# Patient Record
Sex: Male | Born: 1955 | Hispanic: No | Marital: Married | Smoking: Current every day smoker
Health system: Southern US, Community
[De-identification: ages and names within clinical notes are randomized; demographics above are authoritative.]

## PROBLEM LIST (undated history)

## (undated) ENCOUNTER — Ambulatory Visit (HOSPITAL_COMMUNITY): Source: Home / Self Care

## (undated) DIAGNOSIS — J45909 Unspecified asthma, uncomplicated: Secondary | ICD-10-CM

## (undated) DIAGNOSIS — N400 Enlarged prostate without lower urinary tract symptoms: Secondary | ICD-10-CM

## (undated) DIAGNOSIS — I10 Essential (primary) hypertension: Secondary | ICD-10-CM

---

## 2019-08-03 ENCOUNTER — Other Ambulatory Visit: Payer: Self-pay

## 2019-08-03 ENCOUNTER — Emergency Department
Admission: EM | Admit: 2019-08-03 | Discharge: 2019-08-03 | Disposition: A | Discharge status: Home / Self Care | Payer: Self-pay | Attending: Student | Admitting: Student

## 2019-08-03 ENCOUNTER — Emergency Department: Payer: Self-pay

## 2019-08-03 DIAGNOSIS — J45901 Unspecified asthma with (acute) exacerbation: Secondary | ICD-10-CM | POA: Insufficient documentation

## 2019-08-03 DIAGNOSIS — F17200 Nicotine dependence, unspecified, uncomplicated: Secondary | ICD-10-CM | POA: Insufficient documentation

## 2019-08-03 LAB — CBC WITH DIFFERENTIAL/PLATELET
Abs Immature Granulocytes: 0.02 10*3/uL (ref 0.00–0.07)
Basophils Absolute: 0.1 10*3/uL (ref 0.0–0.1)
Basophils Relative: 1 %
Eosinophils Absolute: 0.4 10*3/uL (ref 0.0–0.5)
Eosinophils Relative: 7 %
HCT: 50.6 % (ref 39.0–52.0)
Hemoglobin: 17.3 g/dL — ABNORMAL HIGH (ref 13.0–17.0)
Immature Granulocytes: 0 %
Lymphocytes Relative: 41 %
Lymphs Abs: 2.2 10*3/uL (ref 0.7–4.0)
MCH: 30.5 pg (ref 26.0–34.0)
MCHC: 34.2 g/dL (ref 30.0–36.0)
MCV: 89.2 fL (ref 80.0–100.0)
Monocytes Absolute: 0.6 10*3/uL (ref 0.1–1.0)
Monocytes Relative: 10 %
Neutro Abs: 2.2 10*3/uL (ref 1.7–7.7)
Neutrophils Relative %: 41 %
Platelets: 377 10*3/uL (ref 150–400)
RBC: 5.67 MIL/uL (ref 4.22–5.81)
RDW: 13.6 % (ref 11.5–15.5)
WBC: 5.5 10*3/uL (ref 4.0–10.5)
nRBC: 0 % (ref 0.0–0.2)

## 2019-08-03 LAB — COMPREHENSIVE METABOLIC PANEL
ALT: 17 U/L (ref 0–44)
AST: 21 U/L (ref 15–41)
Albumin: 4.2 g/dL (ref 3.5–5.0)
Alkaline Phosphatase: 78 U/L (ref 38–126)
Anion gap: 10 (ref 5–15)
BUN: 17 mg/dL (ref 8–23)
CO2: 24 mmol/L (ref 22–32)
Calcium: 9.4 mg/dL (ref 8.9–10.3)
Chloride: 104 mmol/L (ref 98–111)
Creatinine, Ser: 0.94 mg/dL (ref 0.61–1.24)
GFR calc Af Amer: >60 mL/min (ref >60)
GFR calc non Af Amer: >60 mL/min (ref >60)
Glucose, Bld: 95 mg/dL (ref 70–99)
Potassium: 4.1 mmol/L (ref 3.5–5.1)
Sodium: 138 mmol/L (ref 135–145)
Total Bilirubin: 0.6 mg/dL (ref 0.3–1.2)
Total Protein: 8.2 g/dL — ABNORMAL HIGH (ref 6.5–8.1)

## 2019-08-03 LAB — TROPONIN I (HIGH SENSITIVITY): Troponin I (High Sensitivity): 5 ng/L (ref <18)

## 2019-08-03 MED ORDER — BENZONATATE 100 MG PO CAPS: 100.0000 mg | ORAL_CAPSULE | Freq: Three times a day (TID) | ORAL | 0 refills | Status: AC | PRN

## 2019-08-03 MED ORDER — BENZONATATE 100 MG PO CAPS: 100.0000 mg | ORAL_CAPSULE | Freq: Three times a day (TID) | ORAL | 0 refills | Status: DC | PRN

## 2019-08-03 MED ORDER — ALBUTEROL SULFATE (2.5 MG/3ML) 0.083% IN NEBU
5.0000 mg | INHALATION_SOLUTION | Freq: Once | RESPIRATORY_TRACT | Status: AC
  Administered 2019-08-03: 5 mg via RESPIRATORY_TRACT
  Filled 2019-08-03: qty 6

## 2019-08-03 MED ORDER — PREDNISONE 50 MG PO TABS: 50.0000 mg | ORAL_TABLET | Freq: Every day | ORAL | 0 refills | Status: DC

## 2019-08-03 MED ORDER — IPRATROPIUM BROMIDE 0.02 % IN SOLN
0.5000 mg | RESPIRATORY_TRACT | Status: AC
  Administered 2019-08-03: 0.5 mg via RESPIRATORY_TRACT
  Filled 2019-08-03: qty 2.5

## 2019-08-03 MED ORDER — ALBUTEROL SULFATE (2.5 MG/3ML) 0.083% IN NEBU
2.5000 mg | INHALATION_SOLUTION | Freq: Once | RESPIRATORY_TRACT | Status: AC
  Administered 2019-08-03: 2.5 mg via RESPIRATORY_TRACT
  Filled 2019-08-03: qty 3

## 2019-08-03 MED ORDER — METHYLPREDNISOLONE SODIUM SUCC 125 MG IJ SOLR
125.0000 mg | Freq: Once | INTRAMUSCULAR | Status: AC
  Administered 2019-08-03: 20:00:00 125 mg via INTRAVENOUS
  Filled 2019-08-03: qty 2

## 2019-08-03 MED ORDER — PREDNISONE 50 MG PO TABS: 50.0000 mg | ORAL_TABLET | Freq: Every day | ORAL | 0 refills | Status: AC

## 2019-08-03 MED ORDER — IPRATROPIUM-ALBUTEROL 0.5-2.5 (3) MG/3ML IN SOLN
3.0000 mL | Freq: Once | RESPIRATORY_TRACT | Status: AC
  Administered 2019-08-03: 3 mL via RESPIRATORY_TRACT
  Filled 2019-08-03: qty 3

## 2019-08-03 NOTE — Discharge Instructions (Addendum)
Thank you for letting us take care of you in the emergency department today.   Please continue to take any regular, prescribed medications.   New medications we have prescribed:  - Prednisone - steroid medication to take for the next 5 days - Tessalon - for cough - Please use your albuterol inhaler (yellow) scheduled for the next 2 days - use 4 puffs every 4-6 hours  Please follow up with: - Your primary care doctor to review your ER visit and follow up on your symptoms.   Please return to the ER for any new or worsening symptoms.

## 2019-08-03 NOTE — ED Triage Notes (Signed)
Pt to the er for SOB x 2 days. Pt reports cough with white sputum. Denies fever. Pt denies any medical hx or medications. BP elevated in triage. Sats are adequate but pt is in obvious moderate resp distress. Pt is able to answer questions with some difficulty.

## 2019-08-03 NOTE — ED Provider Notes (Signed)
[facility] Emergency Department Provider Note  ____________________________________________   First MD Initiated Contact with Patient 08/03/19 1926     (approximate)  I have reviewed the triage vital signs and the nursing notes.  History  Chief Complaint Shortness of Breath    HPI Dillon Simmons is a 63 y.o. male with history of asthma who presents for asthma exacerbation, namely wheezing, cough, chest tightness. He reports symptoms have been slowly worsening over the last week or so. He is compliant with his inhalers. He has been using his PRN albuterol inhaler with mild, temporary relief. He has a nonproductive cough. No fevers, chills, vomiting, diarrhea, or sick contacts. No chest pain.    Past Medical Hx History reviewed. No pertinent past medical history.  Problem List There are no active problems to display for this patient.   Past Surgical Hx History reviewed. No pertinent surgical history.  Medications Prior to Admission medications   Not on File    Allergies Patient has no allergy information on record.  Family Hx No family history on file.  Social Hx Social History   Tobacco Use  . Smoking status: Current Every Day Smoker    Packs/day: 1.00  . Smokeless tobacco: Never Used  Substance Use Topics  . Alcohol use: Never    Frequency: Never  . Drug use: Never     Review of Systems  Constitutional: Negative for fever, chills. Eyes: Negative for visual changes. ENT: Negative for sore throat. Cardiovascular: Negative for chest pain. Respiratory: + asthma exacerbation - cough, wheezing Gastrointestinal: Negative for nausea, vomiting.  Genitourinary: Negative for dysuria. Musculoskeletal: Negative for leg swelling. Skin: Negative for rash. Neurological: Negative for for headaches.   Physical Exam  Vital Signs: ED Triage Vitals  Enc Vitals Group     BP 08/03/19 1909 (!) 201/95     Pulse Rate 08/03/19 1909 63     Resp  08/03/19 1909 (!) 30     Temp 08/03/19 1909 97.8 F (36.6 C)     Temp Source 08/03/19 1909 Oral     SpO2 08/03/19 1909 97 %     Weight 08/03/19 1911 209 lb (94.8 kg)     Height 08/03/19 1911 6\' 2"  (1.88 m)     Head Circumference --      Peak Flow --      Pain Score 08/03/19 1910 5     Pain Loc --      Pain Edu? --      Excl. in GC? --     Constitutional: Alert and oriented.  Head: Normocephalic. Atraumatic. Eyes: Conjunctivae clear. Sclera anicteric. Nose: No congestion. No rhinorrhea. Mouth/Throat: Mucous membranes are moist.  Neck: No stridor.   Cardiovascular: Normal rate, regular rhythm. Extremities well perfused. Respiratory: Wheezing throughout with moderately decreased air movement. No focal decreased BS. Dry cough. Speaking full sentences. Gastrointestinal: Soft. Non-tender. Non-distended.  Musculoskeletal: No lower extremity edema. No deformities. Neurologic:  Normal speech and language. No gross focal neurologic deficits are appreciated.  Skin: Skin is warm, dry and intact. No rash noted. Psychiatric: Mood and affect are appropriate for situation.  EKG  Personally reviewed.   Rate: within normal limits Rhythm: sinus Axis: normal Intervals: within normal limits Moderate baseline artifact No acute ischemic changes No STEMI     Radiology  XR:  IMPRESSION:  No acute abnormality of the lungs in AP portable projection.    Procedures  Procedure(s) performed (including critical care):  Procedures   Initial Impression /  Assessment and Plan / ED Course  63 y.o. male who presents to the ED for asthma exacerbation  Ddx: presentation c/w exacerbation of known asthma, consider underlying pulmonary infection as well  Plan: labs, steroids, nebulizer, XR  XR negative. Labs w/o actionable derangements. EKG w/o ischemic changes, HS troponin negative. After Solumedrol and nebulizer x 2 patient reports marked improvement in symptoms. On exam, he is moving air  much better with only faint scattered wheezes. As such, will discharge with course of prednisone, cough medicine. Advised scheduled albuterol for next several days. Recommend PCP follow up and given return precautions.     Final Clinical Impression(s) / ED Diagnosis  Final diagnoses:  Moderate asthma with exacerbation, unspecified whether persistent       Note:  This document was prepared using Dragon voice recognition software and may include unintentional dictation errors.   Lilia Pro., MD 08/04/19 [phone]

## 2020-12-01 IMAGING — DX DG CHEST 1V PORT
1 series · 2 of 2 positions shown · non-contrast
Comparison: None.

CLINICAL DATA: Shortness of breath, cough

EXAM:
PORTABLE CHEST 1 VIEW

[Series 1: chest ap · 0.14mm/px · 2 of 2 slices shown]
[im 1/2]
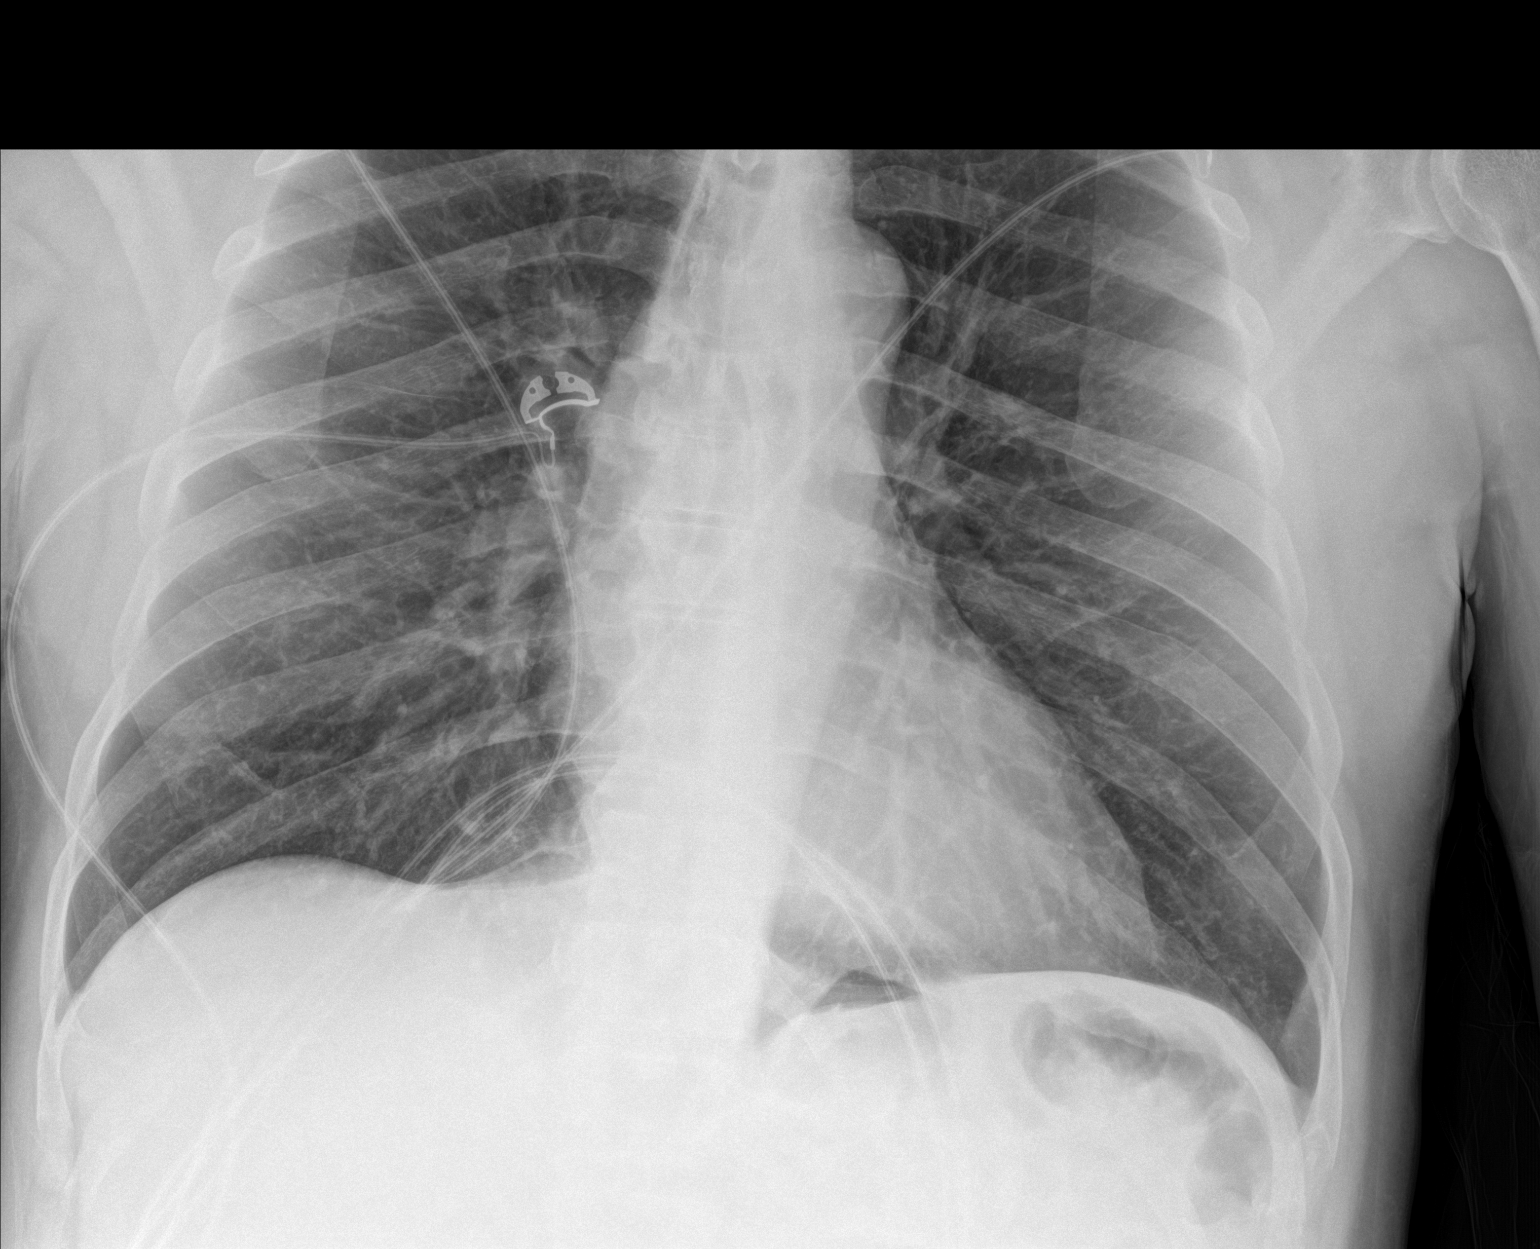
[im 2/2]
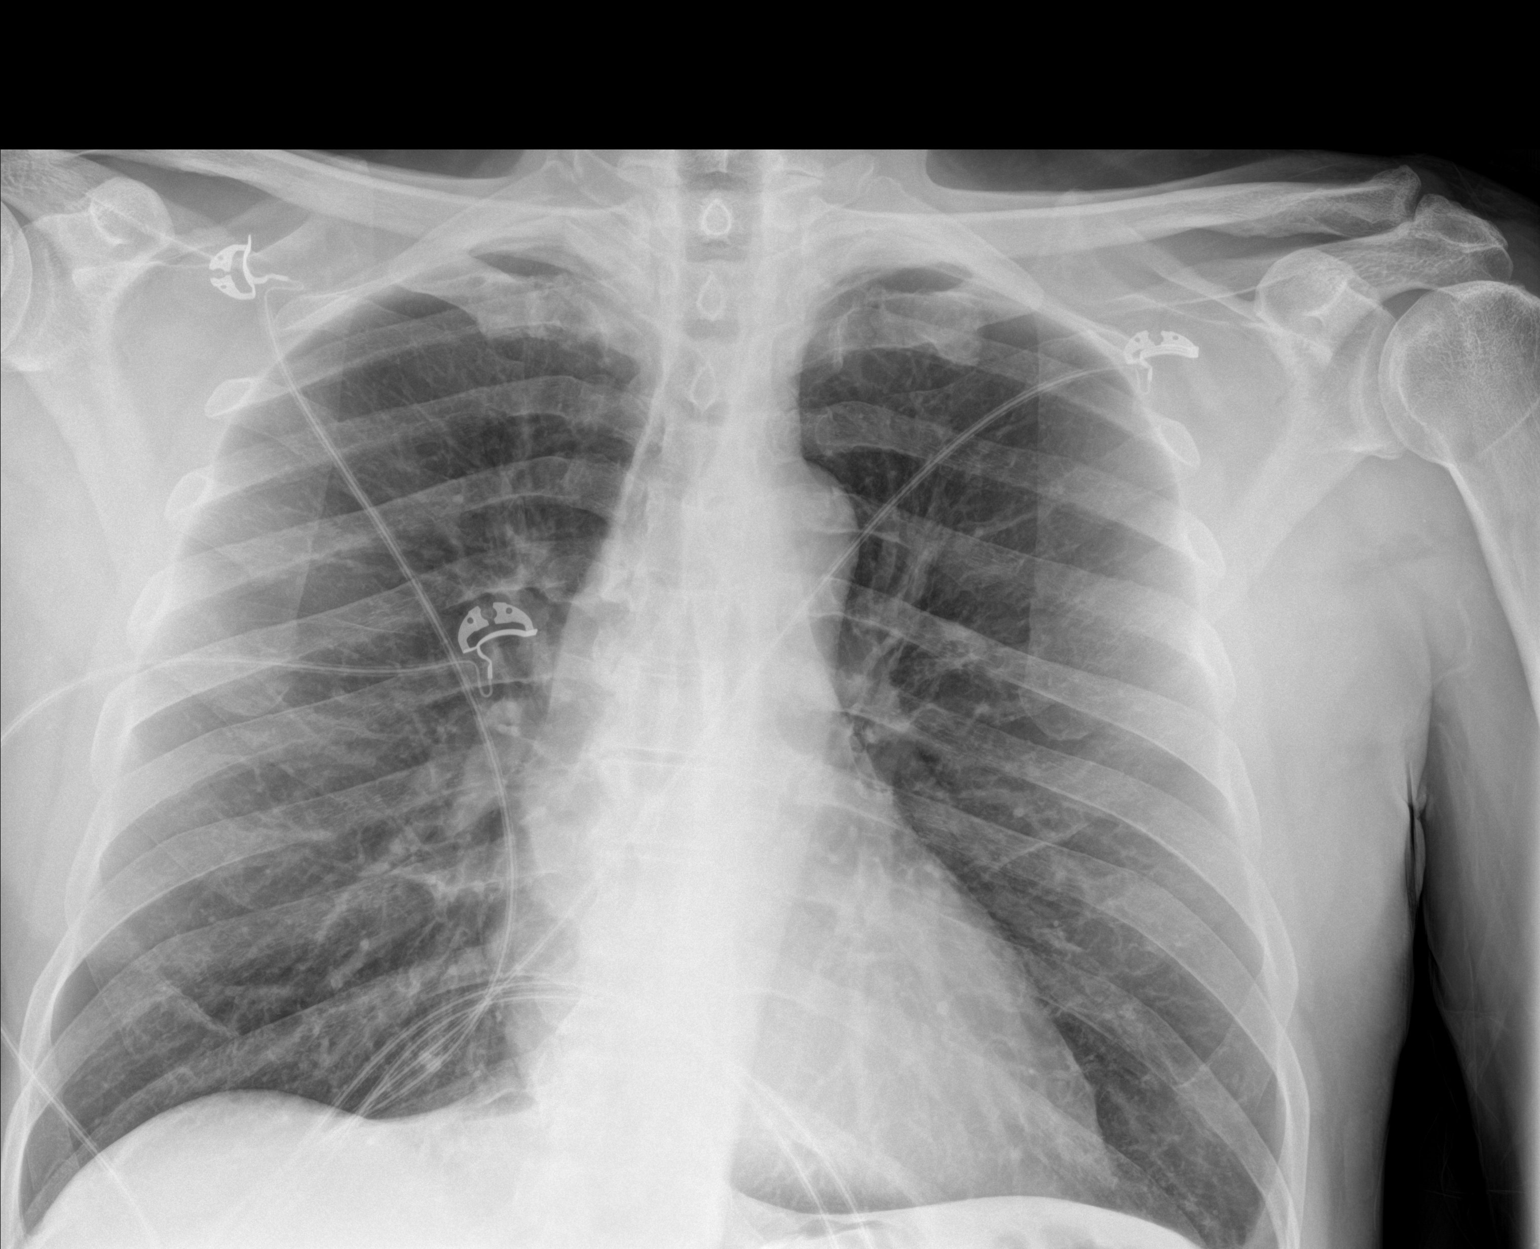

[2 of 2 positions shown; findings below may reference images not displayed]

FINDINGS: The heart size and mediastinal contours are within normal limits.
Both lungs are clear. The visualized skeletal structures are
unremarkable.
IMPRESSION: No acute abnormality of the lungs in AP portable projection.

## 2021-02-20 ENCOUNTER — Emergency Department
Admission: EM | Admit: 2021-02-20 | Discharge: 2021-02-20 | Disposition: A | Discharge status: Home / Self Care | Payer: Self-pay | Attending: Emergency Medicine | Admitting: Emergency Medicine

## 2021-02-20 ENCOUNTER — Other Ambulatory Visit: Payer: Self-pay

## 2021-02-20 DIAGNOSIS — F17200 Nicotine dependence, unspecified, uncomplicated: Secondary | ICD-10-CM | POA: Insufficient documentation

## 2021-02-20 DIAGNOSIS — K5900 Constipation, unspecified: Secondary | ICD-10-CM | POA: Insufficient documentation

## 2021-02-20 DIAGNOSIS — K645 Perianal venous thrombosis: Secondary | ICD-10-CM | POA: Insufficient documentation

## 2021-02-20 DIAGNOSIS — J45909 Unspecified asthma, uncomplicated: Secondary | ICD-10-CM | POA: Insufficient documentation

## 2021-02-20 HISTORY — DX: Unspecified asthma, uncomplicated: J45.909

## 2021-02-20 MED ORDER — HYDROCORTISONE ACETATE 25 MG RE SUPP: 25.0000 mg | Freq: Two times a day (BID) | RECTAL | 0 refills | Status: AC

## 2021-02-20 MED ORDER — HYDROCORT-PRAMOXINE (PERIANAL) 1-1 % EX FOAM
1.0000 | Freq: Once | CUTANEOUS | Status: AC
  Administered 2021-02-20: 1 via RECTAL
  Filled 2021-02-20: qty 10

## 2021-02-20 NOTE — Discharge Instructions (Addendum)
You were seen today for an external thrombosed hemorrhoid.  I am giving you prescription for Anusol suppositories to use twice daily for the next 6 days.  Please start Colace 100 mg daily at bedtime to help soften your stool and ease constipation.  Please consume adequate water and a high-fiber diet.  You can follow-up with Dr. Tobi Bastos if symptoms persist or worsen.

## 2021-02-20 NOTE — ED Triage Notes (Signed)
Pt states he has had hemorrhoids for a week and it has made it hard to sit and drive

## 2021-02-20 NOTE — ED Provider Notes (Signed)
[facility] Emergency Department Provider Note ____________________________________________  Time seen: 1640  I have reviewed the triage vital signs and the nursing notes.  HISTORY  Chief Complaint  Hemorrhoids   HPI Dillon Simmons is a 65 y.o. male presents to the ER today with complaint of rectal pain due to hemorrhoids.  He noticed this 1 week ago.  He reports the pain is sharp and stabbing, worse with trying to sit down or drive.  He has not had any issues with constipation prior to the onset of this hemorrhoid, but reports he has felt constipated for the last 2 days.  He is currently passing gas.  He denies nausea or vomiting.  He denies rectal itching or bleeding at this time.  He has tried Preparation H OTC with minimal relief of symptoms.  Past Medical History:  Diagnosis Date  . Asthma     There are no problems to display for this patient.   History reviewed. No pertinent surgical history.  Prior to Admission medications   Medication Sig Start Date End Date Taking? Authorizing Provider  hydrocortisone (ANUSOL-HC) 25 MG suppository Place 1 suppository (25 mg total) rectally every 12 (twelve) hours. 02/20/21 02/20/22 Yes BaitySalvadore Oxford, NP    Allergies Patient has no known allergies.  No family history on file.  Social History Social History   Tobacco Use  . Smoking status: Current Every Day Smoker    Packs/day: 1.00  . Smokeless tobacco: Never Used  Substance Use Topics  . Alcohol use: Never  . Drug use: Never    Review of Systems  Constitutional: Negative for fever, chills or body aches. Cardiovascular: Negative for chest pain or chest tightness. Respiratory: Negative for cough or shortness of breath. Gastrointestinal: Positive for constipation and hemorrhoids.  Negative for abdominal pain, nausea, vomiting or blood in his stool. ____________________________________________  PHYSICAL EXAM:  VITAL SIGNS: ED Triage Vitals  Enc Vitals  Group     BP 02/20/21 1609 (!) 173/99     Pulse Rate 02/20/21 1609 60     Resp 02/20/21 1609 18     Temp 02/20/21 1610 98.4 F (36.9 C)     Temp Source 02/20/21 1610 Oral     SpO2 02/20/21 1609 97 %     Weight 02/20/21 1607 230 lb (104.3 kg)     Height 02/20/21 1607 6\' 1"  (1.854 m)     Head Circumference --      Peak Flow --      Pain Score 02/20/21 1607 9     Pain Loc --      Pain Edu? --      Excl. in GC? --     Constitutional: Alert and oriented.  Obese, appears uncomfortable but in no distress. Cardiovascular: Normal rate, regular rhythm.  Respiratory: Normal respiratory effort. No wheezes/rales/rhonchi. Gastrointestinal: Hypoactive bowel sounds.  Slightly distended but soft and nontender. Rectal: Thrombosed external hemorrhoid noted at 6 O'Clock. Neurologic:  Normal gait without ataxia. Normal speech and language. No gross focal neurologic deficits are appreciated. ____________________________________________  INITIAL IMPRESSION / ASSESSMENT AND PLAN / ED COURSE  Constipation due to Thrombosed External Hemorrhoid:  Proctofoam HC x 1 in ER RX for Anusol suppositories 25 mg PR BID x 6 days Advised him to take Colace 100 mg PO QHS Encouraged adequate water intake, high fiber diet Will give him referral information for GI, Dr. 02/22/21 in case symptoms persist or worsen  ____________________________________________  FINAL CLINICAL IMPRESSION(S) / ED DIAGNOSES  Final  diagnoses:  Thrombosed external hemorrhoid  Constipation, unspecified constipation type      Lorre Munroe, NP 02/20/21 1745    Concha Se, MD 02/22/21 [phone]

## 2024-05-21 ENCOUNTER — Encounter (HOSPITAL_COMMUNITY): Payer: Self-pay | Admitting: Emergency Medicine

## 2024-05-21 ENCOUNTER — Ambulatory Visit (HOSPITAL_COMMUNITY): Admission: EM | Admit: 2024-05-21 | Discharge: 2024-05-21 | Disposition: A | Discharge status: Home / Self Care

## 2024-05-21 ENCOUNTER — Ambulatory Visit (INDEPENDENT_AMBULATORY_CARE_PROVIDER_SITE_OTHER)

## 2024-05-21 DIAGNOSIS — B9789 Other viral agents as the cause of diseases classified elsewhere: Secondary | ICD-10-CM | POA: Diagnosis not present

## 2024-05-21 DIAGNOSIS — R051 Acute cough: Secondary | ICD-10-CM

## 2024-05-21 DIAGNOSIS — J4521 Mild intermittent asthma with (acute) exacerbation: Secondary | ICD-10-CM

## 2024-05-21 DIAGNOSIS — J988 Other specified respiratory disorders: Secondary | ICD-10-CM

## 2024-05-21 HISTORY — DX: Essential (primary) hypertension: I10

## 2024-05-21 HISTORY — DX: Benign prostatic hyperplasia without lower urinary tract symptoms: N40.0

## 2024-05-21 LAB — POC SARS CORONAVIRUS 2 AG -  ED: SARS Coronavirus 2 Ag: NEGATIVE

## 2024-05-21 MED ORDER — BENZONATATE 100 MG PO CAPS
100.0000 mg | ORAL_CAPSULE | Freq: Three times a day (TID) | ORAL | 0 refills | Status: AC
Start: 2024-05-21 — End: ?

## 2024-05-21 MED ORDER — IPRATROPIUM-ALBUTEROL 0.5-2.5 (3) MG/3ML IN SOLN
RESPIRATORY_TRACT | Status: AC
Start: 2024-05-21 — End: 2024-05-21
  Filled 2024-05-21: qty 3

## 2024-05-21 MED ORDER — DEXAMETHASONE SODIUM PHOSPHATE 10 MG/ML IJ SOLN
INTRAMUSCULAR | Status: AC
  Filled 2024-05-21: qty 1

## 2024-05-21 MED ORDER — PREDNISONE 20 MG PO TABS
40.0000 mg | ORAL_TABLET | Freq: Every day | ORAL | 0 refills | Status: AC
Start: 2024-05-21 — End: 2024-05-26

## 2024-05-21 MED ORDER — PROMETHAZINE-DM 6.25-15 MG/5ML PO SYRP: 5.0000 mL | ORAL_SOLUTION | Freq: Every evening | ORAL | 0 refills | Status: AC | PRN

## 2024-05-21 MED ORDER — IPRATROPIUM-ALBUTEROL 0.5-2.5 (3) MG/3ML IN SOLN
3.0000 mL | Freq: Once | RESPIRATORY_TRACT | Status: AC
  Administered 2024-05-21: 3 mL via RESPIRATORY_TRACT

## 2024-05-21 NOTE — ED Triage Notes (Signed)
 Pt had cough for 4-5 days. Reports will get up little bit of green phlegm. Pt had little fever yesterday 100.1, took Tylenol once last night.

## 2024-05-21 NOTE — ED Provider Notes (Signed)
 [facility]    CSN: [id number] Arrival date & time: 05/21/24  1055      History   Chief Complaint Chief Complaint  Patient presents with   Cough    HPI Dillon Simmons is a 68 y.o. male.   Patient presents with cough, nasal congestion, and chest congestion x 3 to 4 days.  Patient states that he did have a slight fever yesterday of 100.1 and took Tylenol last night with relief of this.  Patient states that his cough is productive and sometimes coughs up some green phlegm.  Patient states that he was up all night due to coughing.  Denies shortness of breath, chest pain, nausea, vomiting, diarrhea, and abdominal pain.  Patient denies any known sick contacts.  Patient denies taking any other medication for his symptoms.  Patient does report history of asthma but states that this is well-controlled.  Patient does use Dulera inhaler daily.  Patient states that he has not needed to use his albuterol  inhaler over the last few days.  The history is provided by the patient and medical records.  Cough   Past Medical History:  Diagnosis Date   Asthma    Enlarged prostate    Hypertension     There are no active problems to display for this patient.   History reviewed. No pertinent surgical history.     Home Medications    Prior to Admission medications   Medication Sig Start Date End Date Taking? Authorizing Provider  albuterol  (VENTOLIN  HFA) 108 (90 Base) MCG/ACT inhaler Inhale 2 puffs into the lungs. 03/20/24  Yes [provider]  benzonatate  (TESSALON ) 100 MG capsule Take 1 capsule (100 mg total) by mouth every 8 (eight) hours. 05/21/24  Yes Johnie Flaming A, NP  finasteride (PROSCAR) 5 MG tablet Take 5 mg by mouth. 03/20/24 03/20/25 Yes [provider]  losartan (COZAAR) 100 MG tablet Take 100 mg by mouth. 03/20/24 03/20/25 Yes [provider]  mometasone-formoterol (DULERA) 100-5 MCG/ACT AERO Inhale 2 puffs into the lungs. 03/20/24  Yes [provider]  predniSONE  (DELTASONE ) 20 MG tablet Take 2 tablets (40 mg total) by mouth daily for 5 days. 05/21/24 05/26/24 Yes Johnie Flaming A, NP  promethazine -dextromethorphan (PROMETHAZINE -DM) 6.25-15 MG/5ML syrup Take 5 mLs by mouth at bedtime as needed for cough. 05/21/24  Yes Johnie, Elim Peale A, NP  tadalafil (CIALIS) 10 MG tablet Take 10 mg by mouth. 03/28/24 07/16/24 Yes [provider]  tamsulosin (FLOMAX) 0.4 MG CAPS capsule Take 0.4 mg by mouth. 03/20/24 03/20/25 Yes [provider]    Family History No family history on file.  Social History Social History   Tobacco Use   Smoking status: Every Day    Current packs/day: 1.00    Types: Cigarettes   Smokeless tobacco: Never  Substance Use Topics   Alcohol use: Never   Drug use: Never     Allergies   Atorvastatin   Review of Systems Review of Systems  Respiratory:  Positive for cough.    Per HPI  Physical Exam Triage Vital Signs ED Triage Vitals [05/21/24 1125]  Encounter Vitals Group     BP 110/76     Girls Systolic BP Percentile      Girls Diastolic BP Percentile      Boys Systolic BP Percentile      Boys Diastolic BP Percentile      Pulse Rate 91     Resp 18     Temp 98 F (36.7  C)     Temp Source Oral     SpO2 91 %     Weight      Height      Head Circumference      Peak Flow      Pain Score      Pain Loc      Pain Education      Exclude from Growth Chart    No data found.  Updated Vital Signs BP 110/76 (BP Location: Left Arm)   Pulse 91   Temp 98 F (36.7 C) (Oral)   Resp 18   SpO2 91%   Visual Acuity Right Eye Distance:   Left Eye Distance:   Bilateral Distance:    Right Eye Near:   Left Eye Near:    Bilateral Near:     Physical Exam Vitals and nursing note reviewed.  Constitutional:      General: He is awake. He is not in acute distress.    Appearance: Normal appearance. He is well-developed and well-groomed. He is not ill-appearing.  HENT:     Right  Ear: Tympanic membrane, ear canal and external ear normal.     Left Ear: Tympanic membrane, ear canal and external ear normal.     Nose: Congestion and rhinorrhea present.     Mouth/Throat:     Mouth: Mucous membranes are moist.     Pharynx: Posterior oropharyngeal erythema present.  Cardiovascular:     Rate and Rhythm: Normal rate and regular rhythm.  Pulmonary:     Effort: Pulmonary effort is normal.     Breath sounds: Examination of the right-middle field reveals wheezing. Examination of the right-lower field reveals wheezing. Wheezing present.  Skin:    General: Skin is warm and dry.  Neurological:     Mental Status: He is alert.  Psychiatric:        Behavior: Behavior is cooperative.      UC Treatments / Results  Labs (all labs ordered are listed, but only abnormal results are displayed) Labs Reviewed  POC SARS CORONAVIRUS 2 AG -  ED    EKG   Radiology DG Chest 2 View Result Date: 05/21/2024 CLINICAL DATA:  Cough. EXAM: CHEST - 2 VIEW COMPARISON:  08/03/2019. FINDINGS: The heart size and mediastinal contours are within normal limits. No focal consolidation, pleural effusion, or pneumothorax. No acute osseous abnormality. IMPRESSION: No acute cardiopulmonary findings. Electronically Signed   By: Harrietta Sherry M.D.   On: 05/21/2024 13:03    Procedures Procedures (including critical care time)  Medications Ordered in UC Medications  ipratropium-albuterol  (DUONEB) 0.5-2.5 (3) MG/3ML nebulizer solution 3 mL (3 mLs Nebulization Given 05/21/24 1223)    Initial Impression / Assessment and Plan / UC Course  I have reviewed the triage vital signs and the nursing notes.  Pertinent labs & imaging results that were available during my care of the patient were reviewed by me and considered in my medical decision making (see chart for details).     Patient is overall well-appearing.  Vitals are stable.  SpO2 is initially 91%.  Wheezing auscultated in right lower and middle  lobe.  Mild congestion and rhinorrhea are present, mild erythema noted to pharynx.  COVID testing negative.  X-ray ordered to rule out underlying respiratory illness.  Based on my interpretation there is no active cardiopulmonary disease.  Radiology report confirms this.  Given DuoNeb in clinic with relief of wheezing.  SpO2 also increased to 94% after DuoNeb.  Symptoms likely related  to asthma exacerbation from viral respiratory illness.  Prescribed prednisone  burst for asthma exacerbation.  Prescribed Tessalon  Promethazine  DM as needed for cough.  Discussed proper use of previously prescribed inhalers.  Discussed follow-up, return, and strict ER precautions Final Clinical Impressions(s) / UC Diagnoses   Final diagnoses:  Acute cough  Mild intermittent asthma with acute exacerbation  Viral respiratory illness     Discharge Instructions      As discussed I believe you have a viral respiratory illness that exacerbated your underlying asthma. Start taking 2 tablets of prednisone  once daily for 5 days to help with this. You can take Tessalon  every 8 hours as needed for cough. Take Promethazine  DM cough syrup at bedtime as needed for cough.  This can make you drowsy so do not drive, work, or drink alcohol while taking this. Be sure you are using your Dulera inhaler daily to help with maintenance of your asthma. Use your albuterol  inhaler every 6 hours as needed for shortness of breath or wheezing. Follow-up with your primary care provider or return here as needed. If you develop severe trouble breathing, chest pain, high fevers, or severe weakness please seek immediate medical treatment in the emergency department.     ED Prescriptions     Medication Sig Dispense Auth. Provider   predniSONE  (DELTASONE ) 20 MG tablet Take 2 tablets (40 mg total) by mouth daily for 5 days. 10 tablet Johnie Flaming A, NP   benzonatate  (TESSALON ) 100 MG capsule Take 1 capsule (100 mg total) by mouth every 8  (eight) hours. 21 capsule Johnie Flaming A, NP   promethazine -dextromethorphan (PROMETHAZINE -DM) 6.25-15 MG/5ML syrup Take 5 mLs by mouth at bedtime as needed for cough. 118 mL Johnie Flaming A, NP      PDMP not reviewed this encounter.   Johnie Flaming A, NP 05/21/24 1329

## 2024-05-21 NOTE — Discharge Instructions (Signed)
 As discussed I believe you have a viral respiratory illness that exacerbated your underlying asthma. Start taking 2 tablets of prednisone  once daily for 5 days to help with this. You can take Tessalon  every 8 hours as needed for cough. Take Promethazine  DM cough syrup at bedtime as needed for cough.  This can make you drowsy so do not drive, work, or drink alcohol while taking this. Be sure you are using your Dulera inhaler daily to help with maintenance of your asthma. Use your albuterol  inhaler every 6 hours as needed for shortness of breath or wheezing. Follow-up with your primary care provider or return here as needed. If you develop severe trouble breathing, chest pain, high fevers, or severe weakness please seek immediate medical treatment in the emergency department.

## 2024-05-27 ENCOUNTER — Ambulatory Visit (HOSPITAL_COMMUNITY)
Admission: EM | Admit: 2024-05-27 | Discharge: 2024-05-27 | Disposition: A | Discharge status: Home / Self Care | Attending: Emergency Medicine | Admitting: Emergency Medicine

## 2024-05-27 ENCOUNTER — Other Ambulatory Visit: Payer: Self-pay

## 2024-05-27 ENCOUNTER — Encounter (HOSPITAL_COMMUNITY): Payer: Self-pay

## 2024-05-27 VITALS — BP 140/80 | HR 78 | Temp 98.0°F | Resp 18

## 2024-05-27 DIAGNOSIS — I10 Essential (primary) hypertension: Secondary | ICD-10-CM | POA: Diagnosis not present

## 2024-05-27 DIAGNOSIS — R051 Acute cough: Secondary | ICD-10-CM

## 2024-05-27 DIAGNOSIS — Z720 Tobacco use: Secondary | ICD-10-CM | POA: Diagnosis not present

## 2024-05-27 MED ORDER — DOXYCYCLINE HYCLATE 100 MG PO CAPS: 100.0000 mg | ORAL_CAPSULE | Freq: Two times a day (BID) | ORAL | 0 refills | Status: AC

## 2024-05-27 NOTE — ED Provider Notes (Signed)
 [facility]    CSN: [id number] Arrival date & time: 05/27/24  1014      History   Chief Complaint Chief Complaint  Patient presents with   Cough    HPI Dillon Simmons is a 68 y.o. male.   68 year old male, Dillon Simmons,  presents to urgent care for evaluation of persistent cough.  Patient states he was here 2 weeks prior for same and was prescribed Tessalon  and prednisone .  Patient states he has taken his medications without relief  PMH: vape use, asthma  The history is provided by the patient and the spouse. No language interpreter was used.    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 No pertinent surgical history.     Home Medications    Prior to Admission medications   Medication Sig Start Date End Date Taking? Authorizing Provider  doxycycline  (VIBRAMYCIN ) 100 MG capsule Take 1 capsule (100 mg total) by mouth 2 (two) times daily for 7 days. 05/27/24 06/03/24 Yes Gatlin Kittell, Rilla, NP  albuterol  (VENTOLIN  HFA) 108 (90 Base) MCG/ACT inhaler Inhale 2 puffs into the lungs. 03/20/24   [provider]  benzonatate  (TESSALON ) 100 MG capsule Take 1 capsule (100 mg total) by mouth every 8 (eight) hours. 05/21/24   Johnie Flaming A, NP  finasteride (PROSCAR) 5 MG tablet Take 5 mg by mouth. 03/20/24 03/20/25  [provider]  losartan (COZAAR) 100 MG tablet Take 100 mg by mouth. 03/20/24 03/20/25  [provider]  mometasone-formoterol (DULERA) 100-5 MCG/ACT AERO Inhale 2 puffs into the lungs. 03/20/24   [provider]  promethazine -dextromethorphan (PROMETHAZINE -DM) 6.25-15 MG/5ML syrup Take 5 mLs by mouth at bedtime as needed for cough. 05/21/24   Johnie Flaming A, NP  tadalafil (CIALIS) 10 MG tablet Take 10 mg by mouth. 03/28/24 07/16/24  [provider]  tamsulosin (FLOMAX) 0.4 MG CAPS capsule Take 0.4 mg by mouth. 03/20/24 03/20/25  [provider]    Family History History reviewed. No pertinent family history.  Social History Social History   Tobacco Use   Smoking status: Every Day    Current packs/day: 1.00    Types: Cigarettes   Smokeless tobacco: Never  Substance Use Topics   Alcohol use: Never   Drug use: Never     Allergies   Atorvastatin   Review of Systems Review of Systems  Constitutional:  Negative for fever.  HENT:  Positive for congestion.   Respiratory:  Positive for cough. Negative for shortness of breath, wheezing and stridor.   All other systems reviewed and are negative.    Physical Exam Triage Vital Signs ED Triage Vitals  Encounter Vitals Group     BP 05/27/24 1029 (!) 140/80     Girls Systolic BP Percentile --      Girls Diastolic BP Percentile --      Boys Systolic BP Percentile --      Boys Diastolic BP Percentile --      Pulse Rate 05/27/24 1029 78     Resp 05/27/24 1029 18     Temp 05/27/24 1029 98 F (36.7 C)     Temp Source 05/27/24 1029 Oral     SpO2  05/27/24 1029 98 %     Weight --      Height --      Head Circumference --      Peak Flow --      Pain Score 05/27/24 1030 0     Pain Loc --      Pain Education --      Exclude from Growth Chart --    No data found.  Updated Vital Signs BP (!) 140/80 (BP Location: Right Arm)   Pulse 78   Temp 98 F (36.7 C) (Oral)   Resp 18   SpO2 98%   Visual Acuity Right Eye Distance:   Left Eye Distance:   Bilateral Distance:    Right Eye Near:   Left Eye Near:    Bilateral Near:     Physical Exam Vitals and nursing note reviewed.  Constitutional:      General: He is not in acute distress.    Appearance: He is well-developed. He is not ill-appearing or toxic-appearing.  HENT:     Head: Normocephalic.      Right Ear: Tympanic membrane is retracted.     Left Ear: Tympanic membrane is retracted.     Nose: Mucosal edema and congestion present.     Mouth/Throat:     Mouth: Mucous membranes are moist.     Pharynx: Uvula midline.  Eyes:     General: Lids are normal.     Conjunctiva/sclera: Conjunctivae normal.     Pupils: Pupils are equal, round, and reactive to light.  Cardiovascular:     Rate and Rhythm: Normal rate and regular rhythm.     Heart sounds: Normal heart sounds.  Pulmonary:     Effort: Pulmonary effort is normal. No respiratory distress.     Breath sounds: Normal breath sounds and air entry. No decreased breath sounds or wheezing.  Abdominal:     General: There is no distension.     Palpations: Abdomen is soft.  Musculoskeletal:        General: Normal range of motion.     Cervical back: Normal range of motion.  Skin:    General: Skin is warm and dry.     Findings: No rash.  Neurological:     General: No focal deficit present.     Mental Status: He is alert and oriented to person, place, and time.     GCS: GCS eye subscore is 4. GCS verbal subscore is 5. GCS motor subscore is 6.     Cranial Nerves: No cranial nerve deficit.     Sensory: No sensory deficit.  Psychiatric:        Speech: Speech normal.        Behavior: Behavior normal. Behavior is cooperative.      UC Treatments / Results  Labs (all labs ordered are listed, but only abnormal results are displayed) Labs Reviewed - No data to display  EKG   Radiology No results found.  Procedures Procedures (including critical care time)  Medications Ordered in UC Medications - No data to display  Initial Impression / Assessment and Plan / UC Course  I have reviewed the triage vital signs and the nursing notes.  Pertinent labs & imaging results that were available during my care of the patient were reviewed by me and considered in my medical decision making (see chart for details).    Discussed exam  findings and plan of care with patient, doxycycline  scripted, strict go to ER precautions given.  Patient verbalized understanding to this provider.  Ddx: Acute cough, Vape use, elevated blood pressure reading Final Clinical Impressions(s) / UC Diagnoses   Final diagnoses:  Acute cough  Elevated blood pressure reading in office with diagnosis of hypertension  Vapes nicotine containing substance     Discharge Instructions      Take home meds as prescribed Take doxycycline  as prescribed Follow up with PCP in 2-3 days if symptoms persist Stop vaping Go to the ER for new or worsening issues or concerns    ED Prescriptions     Medication Sig Dispense Auth. Provider   doxycycline  (VIBRAMYCIN ) 100 MG capsule Take 1 capsule (100 mg total) by mouth 2 (two) times daily for 7 days. 14 capsule Yanixan Mellinger, NP      PDMP not reviewed this encounter.   Aminta Loose, NP 05/27/24 1433

## 2024-05-27 NOTE — Discharge Instructions (Addendum)
 Take home meds as prescribed Take doxycycline  as prescribed Follow up with PCP in 2-3 days if symptoms persist Stop vaping Go to the ER for new or worsening issues or concerns

## 2024-05-27 NOTE — ED Triage Notes (Signed)
 Pt states he has a persistent cough for the past 2 weeks, was seen before for same and sent home with Tessalon  pears for cough. Pt states he is taking the medication with no relief. Pt denies any fever or chills.
